# Patient Record
Sex: Female | Born: 1990 | Race: White | Hispanic: No | Marital: Single | Smoking: Current every day smoker
Health system: Southern US, Community
[De-identification: ages and names within clinical notes are randomized; demographics above are authoritative.]

## PROBLEM LIST (undated history)

## (undated) ENCOUNTER — Inpatient Hospital Stay (HOSPITAL_COMMUNITY): Payer: Self-pay

## (undated) DIAGNOSIS — F53 Postpartum depression: Secondary | ICD-10-CM

## (undated) DIAGNOSIS — A6000 Herpesviral infection of urogenital system, unspecified: Secondary | ICD-10-CM

## (undated) DIAGNOSIS — F419 Anxiety disorder, unspecified: Secondary | ICD-10-CM

## (undated) DIAGNOSIS — G43909 Migraine, unspecified, not intractable, without status migrainosus: Secondary | ICD-10-CM

## (undated) DIAGNOSIS — O99345 Other mental disorders complicating the puerperium: Secondary | ICD-10-CM

## (undated) DIAGNOSIS — F410 Panic disorder [episodic paroxysmal anxiety] without agoraphobia: Secondary | ICD-10-CM

## (undated) HISTORY — PX: ADENOIDECTOMY: SUR15

## (undated) HISTORY — PX: TONSILLECTOMY: SUR1361

---
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
## 2010-01-30 ENCOUNTER — Emergency Department (HOSPITAL_COMMUNITY)
Admission: EM | Admit: 2010-01-30 | Discharge: 2010-01-30 | Payer: Self-pay | Source: Home / Self Care | Admitting: Emergency Medicine

## 2010-02-06 ENCOUNTER — Other Ambulatory Visit: Admission: RE | Admit: 2010-02-06 | Discharge: 2010-02-06 | Payer: Self-pay | Admitting: Obstetrics and Gynecology

## 2010-04-08 ENCOUNTER — Emergency Department (HOSPITAL_COMMUNITY): Admission: EM | Admit: 2010-04-08 | Discharge: 2010-04-08 | Payer: Self-pay | Admitting: Emergency Medicine

## 2010-08-10 ENCOUNTER — Encounter: Payer: Self-pay | Admitting: Family Medicine

## 2010-09-27 ENCOUNTER — Emergency Department (HOSPITAL_COMMUNITY)
Admission: EM | Admit: 2010-09-27 | Discharge: 2010-09-27 | Disposition: A | Discharge status: Home / Self Care | Payer: Medicaid Other | Attending: Emergency Medicine | Admitting: Emergency Medicine

## 2010-09-27 ENCOUNTER — Emergency Department (HOSPITAL_COMMUNITY): Payer: Medicaid Other

## 2010-09-27 DIAGNOSIS — Y9229 Other specified public building as the place of occurrence of the external cause: Secondary | ICD-10-CM | POA: Insufficient documentation

## 2010-09-27 DIAGNOSIS — IMO0002 Reserved for concepts with insufficient information to code with codable children: Secondary | ICD-10-CM | POA: Insufficient documentation

## 2010-09-27 DIAGNOSIS — M25539 Pain in unspecified wrist: Secondary | ICD-10-CM | POA: Insufficient documentation

## 2010-09-27 DIAGNOSIS — S8010XA Contusion of unspecified lower leg, initial encounter: Secondary | ICD-10-CM | POA: Insufficient documentation

## 2010-09-27 DIAGNOSIS — S0990XA Unspecified injury of head, initial encounter: Secondary | ICD-10-CM | POA: Insufficient documentation

## 2010-09-27 DIAGNOSIS — S5010XA Contusion of unspecified forearm, initial encounter: Secondary | ICD-10-CM | POA: Insufficient documentation

## 2010-09-27 DIAGNOSIS — R51 Headache: Secondary | ICD-10-CM | POA: Insufficient documentation

## 2010-09-27 LAB — POCT PREGNANCY, URINE: Preg Test, Ur: NEGATIVE

## 2010-10-20 LAB — WOUND CULTURE: Gram Stain: NONE SEEN

## 2011-04-20 LAB — URINALYSIS, ROUTINE W REFLEX MICROSCOPIC
Glucose, UA: NEGATIVE
Hgb urine dipstick: NEGATIVE
Ketones, ur: 15 — AB
Nitrite: NEGATIVE
Protein, ur: NEGATIVE
Specific Gravity, Urine: >1.03 — ABNORMAL HIGH
Urobilinogen, UA: 0.2
pH: 5.5

## 2011-04-20 LAB — D-DIMER, QUANTITATIVE: D-Dimer, Quant: 0.31

## 2011-04-20 LAB — PREGNANCY, URINE: Preg Test, Ur: NEGATIVE

## 2011-12-21 ENCOUNTER — Other Ambulatory Visit (HOSPITAL_COMMUNITY)
Admission: RE | Admit: 2011-12-21 | Discharge: 2011-12-21 | Disposition: A | Discharge status: Home / Self Care | Payer: Medicaid Other | Source: Ambulatory Visit | Attending: Obstetrics and Gynecology | Admitting: Obstetrics and Gynecology

## 2011-12-21 ENCOUNTER — Other Ambulatory Visit: Payer: Self-pay | Admitting: Adult Health

## 2011-12-21 DIAGNOSIS — Z01419 Encounter for gynecological examination (general) (routine) without abnormal findings: Secondary | ICD-10-CM | POA: Insufficient documentation

## 2011-12-21 DIAGNOSIS — Z113 Encounter for screening for infections with a predominantly sexual mode of transmission: Secondary | ICD-10-CM | POA: Insufficient documentation

## 2012-01-14 LAB — OB RESULTS CONSOLE ABO/RH: RH Type: POSITIVE

## 2012-01-14 LAB — OB RESULTS CONSOLE RUBELLA ANTIBODY, IGM: Rubella: IMMUNE

## 2012-01-14 LAB — OB RESULTS CONSOLE HIV ANTIBODY (ROUTINE TESTING): HIV: NONREACTIVE

## 2012-01-14 LAB — OB RESULTS CONSOLE PLATELET COUNT: Platelets: 212 10*3/uL

## 2012-01-29 ENCOUNTER — Encounter (HOSPITAL_COMMUNITY): Payer: Self-pay | Admitting: *Deleted

## 2012-01-29 DIAGNOSIS — R109 Unspecified abdominal pain: Secondary | ICD-10-CM | POA: Insufficient documentation

## 2012-01-29 DIAGNOSIS — O99891 Other specified diseases and conditions complicating pregnancy: Secondary | ICD-10-CM | POA: Insufficient documentation
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
## 2012-01-30 ENCOUNTER — Emergency Department (HOSPITAL_COMMUNITY)
Admission: EM | Admit: 2012-01-30 | Discharge: 2012-01-30 | Disposition: A | Discharge status: Home / Self Care | Payer: Medicaid Other | Attending: Emergency Medicine | Admitting: Emergency Medicine

## 2012-01-30 DIAGNOSIS — R109 Unspecified abdominal pain: Secondary | ICD-10-CM

## 2012-01-30 MED ORDER — ACETAMINOPHEN 325 MG PO TABS
650.0000 mg | ORAL_TABLET | Freq: Once | ORAL | Status: AC
  Administered 2012-01-30: 650 mg via ORAL
  Filled 2012-01-30: qty 2
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
## 2012-05-03 LAB — OB RESULTS CONSOLE PLATELET COUNT: Platelets: 212 10*3/uL

## 2012-05-03 LAB — OB RESULTS CONSOLE HGB/HCT, BLOOD: HCT: 34 %

## 2012-06-02 ENCOUNTER — Other Ambulatory Visit: Payer: Self-pay | Admitting: Obstetrics & Gynecology

## 2012-06-20 ENCOUNTER — Inpatient Hospital Stay (HOSPITAL_COMMUNITY)
Admission: EM | Admit: 2012-06-20 | Discharge: 2012-06-21 | Disposition: A | Discharge status: Home / Self Care | Payer: Medicaid Other | Attending: Obstetrics & Gynecology | Admitting: Obstetrics & Gynecology

## 2012-06-20 ENCOUNTER — Encounter (HOSPITAL_COMMUNITY): Payer: Self-pay | Admitting: *Deleted

## 2012-06-20 ENCOUNTER — Emergency Department (HOSPITAL_COMMUNITY): Payer: Medicaid Other

## 2012-06-20 DIAGNOSIS — O4700 False labor before 37 completed weeks of gestation, unspecified trimester: Secondary | ICD-10-CM | POA: Insufficient documentation

## 2012-06-20 DIAGNOSIS — O99340 Other mental disorders complicating pregnancy, unspecified trimester: Secondary | ICD-10-CM | POA: Insufficient documentation

## 2012-06-20 DIAGNOSIS — F411 Generalized anxiety disorder: Secondary | ICD-10-CM | POA: Insufficient documentation

## 2012-06-20 DIAGNOSIS — O479 False labor, unspecified: Secondary | ICD-10-CM

## 2012-06-20 DIAGNOSIS — F419 Anxiety disorder, unspecified: Secondary | ICD-10-CM

## 2012-06-20 DIAGNOSIS — O99891 Other specified diseases and conditions complicating pregnancy: Secondary | ICD-10-CM | POA: Insufficient documentation

## 2012-06-20 DIAGNOSIS — R079 Chest pain, unspecified: Secondary | ICD-10-CM | POA: Insufficient documentation

## 2012-06-20 HISTORY — DX: Panic disorder (episodic paroxysmal anxiety): F41.0

## 2012-06-20 MED ORDER — SODIUM CHLORIDE 0.9 % IV BOLUS (SEPSIS)
1000.0000 mL | Freq: Once | INTRAVENOUS | Status: AC
  Administered 2012-06-20: 1000 mL via INTRAVENOUS

## 2012-06-20 MED ORDER — ACETAMINOPHEN-CODEINE #3 300-30 MG PO TABS: ORAL_TABLET | ORAL | Status: DC

## 2012-06-20 MED ORDER — HYDROMORPHONE HCL PF 1 MG/ML IJ SOLN
1.0000 mg | Freq: Once | INTRAMUSCULAR | Status: AC
  Administered 2012-06-20: 1 mg via INTRAVENOUS
  Filled 2012-06-20: qty 1

## 2012-06-20 MED ORDER — LORAZEPAM 2 MG/ML IJ SOLN
1.0000 mg | Freq: Once | INTRAMUSCULAR | Status: AC
  Administered 2012-06-20: 1 mg via INTRAVENOUS
  Filled 2012-06-20: qty 1

## 2012-06-20 NOTE — ED Notes (Signed)
Notified by Victorino Dike at [facility] that patient is starting to contract every 2 minutes again.  Dr. Adriana Simas aware and consult to Dr. Emelda Fear placed.

## 2012-06-20 NOTE — Progress Notes (Addendum)
Pt at [facility] G1[facility]0 co chest pain, SOB. FHR reassuring, pt received Dilaudid and Ativan. UCs noted, suggest IV fluid bolus. Pt w/o complaints of abd pain, back pain, cramping or contractions. Spoke with [facility] RN Dr Aileen Pilot called, reccommended fluid bolus with LR

## 2012-06-20 NOTE — ED Notes (Signed)
Received phone call from [place] from [employer]. Patient is having contractions; states to run a NS bolus.

## 2012-06-20 NOTE — ED Notes (Signed)
Patient states she is feeling better.  Respirations have slowed down and become unlabored.  NS infusing without difficulty.  Family at bedside.

## 2012-06-20 NOTE — Progress Notes (Signed)
Spoke with French Ana RN r/t UCs, suggest call to [facility]call [facility] MD. Herbert Seta CNM here at [facility] made aware of patient

## 2012-06-20 NOTE — ED Notes (Addendum)
Chest pain 2-3 days, hx of panic attacks.  Pt is pregnant,34 weeks.  No abd pain, no bleeding.  Has been feeling her baby move.

## 2012-06-21 ENCOUNTER — Encounter (HOSPITAL_COMMUNITY): Payer: Self-pay | Admitting: *Deleted

## 2012-06-21 DIAGNOSIS — O479 False labor, unspecified: Secondary | ICD-10-CM

## 2012-06-21 LAB — URINALYSIS, ROUTINE W REFLEX MICROSCOPIC
Hgb urine dipstick: NEGATIVE
Protein, ur: NEGATIVE mg/dL
Urobilinogen, UA: 0.2 mg/dL (ref 0.0–1.0)

## 2012-06-21 MED ORDER — HYDROXYZINE HCL 50 MG/ML IM SOLN: 50.0000 mg | Freq: Once | INTRAMUSCULAR | Status: DC

## 2012-06-21 MED ORDER — SODIUM CHLORIDE 0.9 % IV SOLN: INTRAVENOUS | Status: DC

## 2012-06-21 MED ORDER — HYDROXYZINE HCL 50 MG PO TABS
50.0000 mg | ORAL_TABLET | Freq: Once | ORAL | Status: AC
  Administered 2012-06-21: 50 mg via ORAL
  Filled 2012-06-21: qty 1

## 2012-06-21 MED ORDER — ACETAMINOPHEN 325 MG PO TABS: 650.0000 mg | ORAL_TABLET | Freq: Four times a day (QID) | ORAL | Status: DC | PRN

## 2012-06-21 MED ORDER — SODIUM CHLORIDE 0.9 % IV BOLUS (SEPSIS)
500.0000 mL | Freq: Once | INTRAVENOUS | Status: AC
  Administered 2012-06-21: 500 mL via INTRAVENOUS

## 2012-06-21 MED ORDER — LACTATED RINGERS IV BOLUS (SEPSIS)
1000.0000 mL | Freq: Once | INTRAVENOUS | Status: DC
Start: 2012-06-21 — End: 2012-06-21

## 2012-06-21 MED ORDER — HYDROXYZINE PAMOATE 25 MG PO CAPS: 25.0000 mg | ORAL_CAPSULE | Freq: Three times a day (TID) | ORAL | Status: DC | PRN

## 2012-06-21 NOTE — ED Notes (Signed)
Bimanual exam completed by Dr. Adriana Simas.  Patient to be transferred to [facility].

## 2012-06-21 NOTE — MAU Provider Note (Signed)
  History     CSN: [id number]  Arrival date and time: 06/20/12 2005   First Provider Initiated Contact with Patient 06/21/12 0159      Chief Complaint  Patient presents with  . Chest Pain   HPI  Pt was seen at [facility] ED for shortness of breath. She states that it comes and goes, and she thought it was an anxiety attack. After being seen there she was found to be medically cleared. However, she was having q3-53min BH contractions. She was examined by Dr. Emelda Fear, and was closed/thick/high. She was sent here to be evaluated for any cervical change. She states that she shortness of breath occurs on conjunction with the braxton hicks contractions. She denies any pain.   Past Medical History  Diagnosis Date  . Pregnant   . Panic attacks     Past Surgical History  Procedure Date  . Tonsillectomy     History reviewed. No pertinent family history.  History  Substance Use Topics  . Smoking status: Never Smoker   . Smokeless tobacco: Not on file  . Alcohol Use: No    Allergies: No Known Allergies  Prescriptions prior to admission  Medication Sig Dispense Refill  . acetaminophen (TYLENOL) 500 MG tablet Take 500 mg by mouth once as needed. For pain      . Prenatal Vit-Fe Fumarate-FA (PRENATAL MULTIVITAMIN) TABS Take 1 tablet by mouth daily.        Review of Systems  Constitutional: Negative for fever.  Eyes: Negative for blurred vision.  Gastrointestinal: Negative for heartburn, nausea, vomiting, abdominal pain, diarrhea and constipation.  Genitourinary: Negative for dysuria, urgency and frequency.  Neurological: Negative for headaches.   Physical Exam   Blood pressure 102/69, pulse 95, temperature 98.1 F (36.7 C), temperature source Oral, resp. rate 20, height 4\' 11"  (1.499 m), weight 69.854 kg (154 lb), SpO2 97.00%.  Physical Exam  Nursing note and vitals reviewed. Constitutional: She is oriented to person, place, and time. She appears well-developed and  well-nourished. No distress.  Cardiovascular: Normal rate.   Respiratory: Effort normal.  GI: Soft.  Genitourinary:        Cervix: closed/50/high FHT: 135, moderate 15x15 accels, no decels Toco: irregular UCs  Neurological: She is alert and oriented to person, place, and time.  Skin: Skin is warm and dry.  Psychiatric: She has a normal mood and affect.    MAU Course  Procedures    Assessment and Plan  Braxton Hicks contractions Discussed PTL versus preterm BH contractions with the patient Reviewed danger signs/when to return Reviewed comfort measures FU with FT as scheduled.   Tawnya Crook 06/21/2012, 2:50 AM

## 2012-06-21 NOTE — ED Notes (Signed)
Report called to [facility] in MAU at [facility].

## 2012-07-20 NOTE — L&D Delivery Note (Signed)
Delivery Note At 2:45 AM a viable female was delivered via Vaginal, Spontaneous Delivery (Presentation: Vertex; Occiput Anterior).  APGAR: 8, 9; weight pending.   Placenta status: Intact, Spontaneous delivered.  Cord:  3 vessel with the following complications: Nuchal Cord reduced.  Cord pH: N/A  Anesthesia: Other Epidural  Episiotomy: None Lacerations: None Suture Repair: None Est. Blood Loss (mL): 250  Mom to postpartum.  Baby to nursery-stable.  Gildardo Cranker 08/03/2012, 3:10 AM

## 2012-08-02 ENCOUNTER — Inpatient Hospital Stay (HOSPITAL_COMMUNITY)
Admission: AD | Admit: 2012-08-02 | Discharge: 2012-08-05 | DRG: 774 | Disposition: A | Discharge status: Home / Self Care | Payer: Medicaid Other | Source: Ambulatory Visit | Attending: Obstetrics & Gynecology | Admitting: Obstetrics & Gynecology

## 2012-08-02 ENCOUNTER — Inpatient Hospital Stay (HOSPITAL_COMMUNITY): Payer: Medicaid Other | Admitting: Anesthesiology

## 2012-08-02 ENCOUNTER — Encounter (HOSPITAL_COMMUNITY): Payer: Self-pay | Admitting: Anesthesiology

## 2012-08-02 ENCOUNTER — Encounter (HOSPITAL_COMMUNITY): Payer: Self-pay

## 2012-08-02 DIAGNOSIS — A6000 Herpesviral infection of urogenital system, unspecified: Secondary | ICD-10-CM | POA: Diagnosis present

## 2012-08-02 DIAGNOSIS — O99892 Other specified diseases and conditions complicating childbirth: Principal | ICD-10-CM | POA: Diagnosis present

## 2012-08-02 DIAGNOSIS — Z22330 Carrier of Group B streptococcus: Secondary | ICD-10-CM

## 2012-08-02 DIAGNOSIS — O98519 Other viral diseases complicating pregnancy, unspecified trimester: Secondary | ICD-10-CM | POA: Diagnosis present

## 2012-08-02 HISTORY — DX: Herpesviral infection of urogenital system, unspecified: A60.00

## 2012-08-02 LAB — CBC
MCH: 30.8 pg (ref 26.0–34.0)
MCHC: 34.8 g/dL (ref 30.0–36.0)
MCV: 88.4 fL (ref 78.0–100.0)
Platelets: 201 10*3/uL (ref 150–400)
RBC: 4.06 MIL/uL (ref 3.87–5.11)

## 2012-08-02 LAB — OB RESULTS CONSOLE GBS: GBS: POSITIVE

## 2012-08-02 MED ORDER — TERBUTALINE SULFATE 1 MG/ML IJ SOLN: 0.2500 mg | Freq: Once | INTRAMUSCULAR | Status: AC | PRN

## 2012-08-02 MED ORDER — PENICILLIN G POTASSIUM 5000000 UNITS IJ SOLR
2.5000 10*6.[IU] | INTRAVENOUS | Status: DC
  Administered 2012-08-02 – 2012-08-03 (×5): 2.5 10*6.[IU] via INTRAVENOUS
  Filled 2012-08-02 (×9): qty 2.5

## 2012-08-02 MED ORDER — PHENYLEPHRINE 40 MCG/ML (10ML) SYRINGE FOR IV PUSH (FOR BLOOD PRESSURE SUPPORT): 80.0000 ug | PREFILLED_SYRINGE | INTRAVENOUS | Status: DC | PRN

## 2012-08-02 MED ORDER — OXYTOCIN 40 UNITS IN LACTATED RINGERS INFUSION - SIMPLE MED
62.5000 mL/h | INTRAVENOUS | Status: DC
  Administered 2012-08-03: 999 mL/h via INTRAVENOUS
  Filled 2012-08-02: qty 1000

## 2012-08-02 MED ORDER — PENICILLIN G POTASSIUM 5000000 UNITS IJ SOLR
5.0000 10*6.[IU] | Freq: Once | INTRAVENOUS | Status: AC
  Administered 2012-08-02: 5 10*6.[IU] via INTRAVENOUS
  Filled 2012-08-02: qty 5

## 2012-08-02 MED ORDER — NALBUPHINE SYRINGE 5 MG/0.5 ML
INJECTION | INTRAMUSCULAR | Status: AC
  Filled 2012-08-02: qty 2

## 2012-08-02 MED ORDER — PRENATAL MULTIVITAMIN CH
1.0000 | ORAL_TABLET | Freq: Every day | ORAL | Status: DC
  Administered 2012-08-02: 1 via ORAL
  Filled 2012-08-02: qty 1

## 2012-08-02 MED ORDER — LIDOCAINE HCL (PF) 1 % IJ SOLN
30.0000 mL | INTRAMUSCULAR | Status: DC | PRN
  Filled 2012-08-02: qty 30

## 2012-08-02 MED ORDER — OXYTOCIN BOLUS FROM INFUSION: 500.0000 mL | INTRAVENOUS | Status: DC

## 2012-08-02 MED ORDER — DIPHENHYDRAMINE HCL 50 MG/ML IJ SOLN: 12.5000 mg | INTRAMUSCULAR | Status: DC | PRN

## 2012-08-02 MED ORDER — NALBUPHINE SYRINGE 5 MG/0.5 ML
10.0000 mg | INJECTION | Freq: Once | INTRAMUSCULAR | Status: AC
  Administered 2012-08-02: 10 mg via INTRAMUSCULAR

## 2012-08-02 MED ORDER — EPHEDRINE 5 MG/ML INJ
10.0000 mg | INTRAVENOUS | Status: DC | PRN
  Filled 2012-08-02: qty 4

## 2012-08-02 MED ORDER — OXYCODONE-ACETAMINOPHEN 5-325 MG PO TABS: 1.0000 | ORAL_TABLET | ORAL | Status: DC | PRN

## 2012-08-02 MED ORDER — FENTANYL 2.5 MCG/ML BUPIVACAINE 1/10 % EPIDURAL INFUSION (WH - ANES)
INTRAMUSCULAR | Status: DC | PRN
  Administered 2012-08-02: 11 mL/h via EPIDURAL

## 2012-08-02 MED ORDER — FENTANYL CITRATE 0.05 MG/ML IJ SOLN
50.0000 ug | INTRAMUSCULAR | Status: DC | PRN
  Administered 2012-08-02 (×4): 50 ug via INTRAVENOUS
  Filled 2012-08-02 (×4): qty 2

## 2012-08-02 MED ORDER — LACTATED RINGERS IV SOLN
INTRAVENOUS | Status: DC
  Administered 2012-08-02 (×2): via INTRAVENOUS

## 2012-08-02 MED ORDER — LACTATED RINGERS IV SOLN: 500.0000 mL | INTRAVENOUS | Status: DC | PRN

## 2012-08-02 MED ORDER — VALACYCLOVIR HCL 500 MG PO TABS
500.0000 mg | ORAL_TABLET | Freq: Two times a day (BID) | ORAL | Status: DC
  Administered 2012-08-02 (×2): 500 mg via ORAL
  Filled 2012-08-02 (×3): qty 1

## 2012-08-02 MED ORDER — NALBUPHINE SYRINGE 5 MG/0.5 ML
10.0000 mg | INJECTION | Freq: Once | INTRAMUSCULAR | Status: AC
  Administered 2012-08-02: 10 mg via INTRAVENOUS

## 2012-08-02 MED ORDER — FENTANYL 2.5 MCG/ML BUPIVACAINE 1/10 % EPIDURAL INFUSION (WH - ANES)
14.0000 mL/h | INTRAMUSCULAR | Status: DC
Start: 2012-08-02 — End: 2012-08-03
  Filled 2012-08-02: qty 125

## 2012-08-02 MED ORDER — ONDANSETRON HCL 4 MG/2ML IJ SOLN: 4.0000 mg | Freq: Four times a day (QID) | INTRAMUSCULAR | Status: DC | PRN

## 2012-08-02 MED ORDER — FLEET ENEMA 7-19 GM/118ML RE ENEM: 1.0000 | ENEMA | RECTAL | Status: DC | PRN

## 2012-08-02 MED ORDER — IBUPROFEN 600 MG PO TABS
600.0000 mg | ORAL_TABLET | Freq: Four times a day (QID) | ORAL | Status: DC | PRN
  Administered 2012-08-03: 600 mg via ORAL
  Filled 2012-08-02: qty 1

## 2012-08-02 MED ORDER — LACTATED RINGERS IV SOLN
500.0000 mL | Freq: Once | INTRAVENOUS | Status: AC
  Administered 2012-08-02: 500 mL via INTRAVENOUS

## 2012-08-02 MED ORDER — FENTANYL CITRATE 0.05 MG/ML IJ SOLN
100.0000 ug | INTRAMUSCULAR | Status: DC | PRN
  Administered 2012-08-02: 100 ug via INTRAVENOUS
  Filled 2012-08-02: qty 2

## 2012-08-02 MED ORDER — EPHEDRINE 5 MG/ML INJ: 10.0000 mg | INTRAVENOUS | Status: DC | PRN

## 2012-08-02 MED ORDER — LIDOCAINE HCL (PF) 1 % IJ SOLN
INTRAMUSCULAR | Status: DC | PRN
  Administered 2012-08-02: 4 mL
  Administered 2012-08-02: 3 mL

## 2012-08-02 MED ORDER — PHENYLEPHRINE 40 MCG/ML (10ML) SYRINGE FOR IV PUSH (FOR BLOOD PRESSURE SUPPORT)
80.0000 ug | PREFILLED_SYRINGE | INTRAVENOUS | Status: DC | PRN
  Filled 2012-08-02: qty 5

## 2012-08-02 MED ORDER — OXYTOCIN 40 UNITS IN LACTATED RINGERS INFUSION - SIMPLE MED
1.0000 m[IU]/min | INTRAVENOUS | Status: DC
  Administered 2012-08-02: 2 m[IU]/min via INTRAVENOUS
  Administered 2012-08-02: 14 m[IU]/min via INTRAVENOUS
  Administered 2012-08-03: 16 m[IU]/min via INTRAVENOUS

## 2012-08-02 MED ORDER — NALBUPHINE SYRINGE 5 MG/0.5 ML
10.0000 mg | INJECTION | Freq: Once | INTRAMUSCULAR | Status: AC
  Administered 2012-08-02: 10 mg via INTRAVENOUS
  Filled 2012-08-02 (×2): qty 0.5

## 2012-08-02 MED ORDER — ACETAMINOPHEN 325 MG PO TABS: 650.0000 mg | ORAL_TABLET | ORAL | Status: DC | PRN

## 2012-08-02 MED ORDER — CITRIC ACID-SODIUM CITRATE 334-500 MG/5ML PO SOLN: 30.0000 mL | ORAL | Status: DC | PRN

## 2012-08-02 NOTE — Anesthesia Procedure Notes (Signed)
Epidural Patient location during procedure: OB Start time: 08/02/2012 11:28 PM  Staffing Anesthesiologist: Deoni Cosey A. Performed by: anesthesiologist   Preanesthetic Checklist Completed: patient identified, site marked, surgical consent, pre-op evaluation, timeout performed, IV checked, risks and benefits discussed and monitors and equipment checked  Epidural Patient position: sitting Prep: site prepped and draped and DuraPrep Patient monitoring: continuous pulse ox and blood pressure Approach: midline Injection technique: LOR air  Needle:  Needle type: Tuohy  Needle gauge: 17 G Needle length: 9 cm and 9 Needle insertion depth: 5 cm cm Catheter type: closed end flexible Catheter size: 19 Gauge Catheter at skin depth: 10 cm Test dose: negative and Other  Assessment Events: blood not aspirated, injection not painful, no injection resistance, negative IV test and no paresthesia  Additional Notes Patient identified. Risks and benefits discussed including failed block, incomplete  Pain control, post dural puncture headache, nerve damage, paralysis, blood pressure Changes, nausea, vomiting, reactions to medications-both toxic and allergic and post Partum back pain. All questions were answered. Patient expressed understanding and wished to proceed. Sterile technique was used throughout procedure. Epidural site was Dressed with sterile barrier dressing. No paresthesias, signs of intravascular injection Or signs of intrathecal spread were encountered.  Patient was more comfortable after the epidural was dosed. Please see RN's note for documentation of vital signs and FHR which are stable.

## 2012-08-02 NOTE — H&P (Signed)
Chief Complaint:  Labor Eval HPI: Alice Foster is a 22 y.o. G1P0 at [redacted]w[redacted]d who presents to maternity admissions reporting contractions for the last two days.  Pt states she has felt increased abdominal pain/pressures since then and her contractions have increased in time.  Now happening every couple of minutes.    Denies leakage of fluid or vaginal bleeding. Good fetal movement. No fever, chills, sweats, SOB, CP, leg edema.   GBS +  Pregnancy Course: Uncomplicated up until this point.   Past Medical History: Past Medical History  Diagnosis Date  . Pregnant   . Panic attacks   . 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 Tonsillectomy   . Adenoidectomy     Family History: Family History  Problem Relation Age of Onset  . Other Neg Hx     Social History: History  Substance Use Topics  . Smoking status: Never Smoker   . Smokeless tobacco: Not on file  . Alcohol Use: No    Allergies: No Known Allergies  Meds:  Prescriptions prior to admission  Medication Sig Dispense Refill  . Prenatal Vit-Fe Fumarate-FA (PRENATAL MULTIVITAMIN) TABS Take 1 tablet by mouth daily.      . valACYclovir (VALTREX) 500 MG tablet Take 500 mg by mouth 2 (two) times daily.      Marland Kitchen acetaminophen (TYLENOL) 500 MG tablet Take 500 mg by mouth once as needed. For pain      . acetaminophen-codeine (TYLENOL #3) 300-30 MG per tablet Take one every 8 hours for severe pain  6 tablet  0  . hydrOXYzine (VISTARIL) 25 MG capsule Take 1 capsule (25 mg total) by mouth 3 (three) times daily as needed for anxiety.  30 capsule  0    ROS: Pertinent findings in history of present illness.  Physical Exam  Blood pressure 120/78, pulse 95, temperature 98.1 F (36.7  C), temperature source Oral, resp. rate 20, height 5' (1.524 m), weight 73.936 kg (163 lb). GENERAL: Well-developed, well-nourished female in no acute distress.  HEENT: normocephalic HEART: RRR RESP:CTA ABDOMEN: Soft, non-tender, gravid appropriate for gestational age EXTREMITIES: Nontender, no edema NEURO: alert and oriented SPECULUM EXAM: NEFG, physiologic discharge, no blood, cervix clean, no evidence of lesions  Dilation: 3 Effacement (%): 80 Station: -3 Presentation: Vertex Exam by:: Lucy Chris RNC  FHT:  Baseline 150 , moderate variability, accelerations present, no decelerations Contractions: q 3-5 mins   Labs: Results for orders placed during the hospital encounter of 08/02/12 (from the past 24 hour(s))  OB RESULTS CONSOLE GBS     Status: Normal      Component Value Range   GBS Positive      Imaging:  No results found.  MAU Course:1)In some abdominal pain secondary to contractions.  Continue to monitor for progressing dilation.     Assessment: Early Labor  GBS + HSV +  Plan: Admission for progression of labor.   PCN for GBS Valtrex for HSV  Bryan R. Paulina Fusi, DO of [facility] 08/02/2012, 4:41 AM  I have seen the patient with the resident and agree with the above.

## 2012-08-02 NOTE — Progress Notes (Signed)
Patient ID: Alice Foster, female   DOB: 1990-08-27, 22 y.o.   MRN: [id number]  S:  Pt in good amount of pain right now, would like epidural.  Ctx steady  O:  Filed Vitals:   08/02/12 2101 08/02/12 2131 08/02/12 2201 08/02/12 2231  BP: 134/80 116/76 125/92 114/68  Pulse: 69 72 82 69  Temp: 97.6 F (36.4 C)     TempSrc: Oral     Resp: 20 18 18 20   Height:      Weight:      SpO2:       Cervix:  5/90/-1 FHTs:  120, mod var, accels present, None TOCO:  q 2-4 mins  A/P 22 y.o. G1P0000 at [redacted]w[redacted]d with SOL - FHTs reassuring - AROM and pitocin started - Continue to monitor for progression of labor  Zully Frane R. Paulina Fusi, DO of [facility] 08/02/2012, 10:47 PM

## 2012-08-02 NOTE — Progress Notes (Signed)
Patient ID: Alice Foster, female   DOB: [birth year], 22 y.o.   MRN: [id number]  S:  Pt has been asking for IV pain meds but does not look that uncomfortable and not contracting that often.  O:  Filed Vitals:   08/02/12 1310 08/02/12 1405 08/02/12 1423 08/02/12 1428  BP:   123/76   Pulse:   83   Temp:   98.1 F (36.7 C)   TempSrc:   Oral   Resp: 18 20 18 20   Height:      Weight:      SpO2:       Cervix:  5/80/-2 FHTs:  120, mod var, accels present, occasional small variable TOCO:  q 6-7 min  A/P 21 y.o. G1P0000 at [redacted]w[redacted]d with SOL - FHTs reassuring - Prolonged latent phase, ctx spacing out. Already AROM'd - Will start pitocin  Napoleon Form, MD 08/02/2012 4:06 PM

## 2012-08-02 NOTE — Progress Notes (Signed)
Admission nutrition screen triggered for weight loss > 10 Lbs over the past month. PNR indicates steady and adequate weight gain. Patients chart reviewed and assessed  for nutritional risk. Patient is determined to be at low nutrition  risk.   Alice Foster Luster LDN Neonatal Nutrition Support Specialist Pager [phone]

## 2012-08-02 NOTE — Anesthesia Preprocedure Evaluation (Signed)
Anesthesia Evaluation  Patient identified by MRN, date of birth, ID band Patient awake    Reviewed: Allergy & Precautions, H&P , Patient's Chart, lab work & pertinent test results  Airway Mallampati: III TM Distance: >3 FB Neck ROM: full    Dental No notable dental hx. (+) Teeth Intact   Pulmonary neg pulmonary ROS,  breath sounds clear to auscultation  Pulmonary exam normal       Cardiovascular negative cardio ROS  Rhythm:regular Rate:Normal     Neuro/Psych Anxiety Panic Attacksnegative neurological ROS     GI/Hepatic negative GI ROS, Neg liver ROS,   Endo/Other  negative endocrine ROS  Renal/GU negative Renal ROS  negative genitourinary   Musculoskeletal   Abdominal Normal abdominal exam  (+)   Peds  Hematology negative hematology ROS (+)   Anesthesia Other Findings   Reproductive/Obstetrics (+) Pregnancy HSV                           Anesthesia Physical Anesthesia Plan  ASA: II  Anesthesia Plan: Epidural   Post-op Pain Management:    Induction:   Airway Management Planned:   Additional Equipment:   Intra-op Plan:   Post-operative Plan:   Informed Consent: I have reviewed the patients History and Physical, chart, labs and discussed the procedure including the risks, benefits and alternatives for the proposed anesthesia with the patient or authorized representative who has indicated his/her understanding and acceptance.     Plan Discussed with: Anesthesiologist  Anesthesia Plan Comments:         Anesthesia Quick Evaluation

## 2012-08-02 NOTE — Progress Notes (Signed)
Patient ID: Alice Foster, female   DOB: [birth year], 22 y.o.   MRN: [id number]  S:  Pt unblocked. Uncomfortable with contractions but does not want epidural.  O:   Filed Vitals:   08/02/12 1423  BP: 123/76  Pulse: 83  Temp:   Resp:    Cervix:  4.5/80/-2 AROM, clear  FHTs:  120, mod var, accels present, occasional variable.   CTX:  q 3-5 min (not tracing well)  A/P SOL, progressing slowly in latent phase. AROM, clear. Anticipate SVD  Napoleon Form, MD 08/02/2012 2:26 PM

## 2012-08-02 NOTE — MAU Note (Signed)
Contractions every 2 minutes x2 days. Denies leaking of fluid or vaginal bleeding.

## 2012-08-03 ENCOUNTER — Encounter (HOSPITAL_COMMUNITY): Payer: Self-pay | Admitting: *Deleted

## 2012-08-03 DIAGNOSIS — A6000 Herpesviral infection of urogenital system, unspecified: Secondary | ICD-10-CM

## 2012-08-03 DIAGNOSIS — O9989 Other specified diseases and conditions complicating pregnancy, childbirth and the puerperium: Secondary | ICD-10-CM

## 2012-08-03 DIAGNOSIS — O98519 Other viral diseases complicating pregnancy, unspecified trimester: Secondary | ICD-10-CM

## 2012-08-03 MED ORDER — DIPHENHYDRAMINE HCL 25 MG PO CAPS: 25.0000 mg | ORAL_CAPSULE | Freq: Four times a day (QID) | ORAL | Status: DC | PRN

## 2012-08-03 MED ORDER — TETANUS-DIPHTH-ACELL PERTUSSIS 5-2.5-18.5 LF-MCG/0.5 IM SUSP
0.5000 mL | Freq: Once | INTRAMUSCULAR | Status: AC
  Administered 2012-08-04: 0.5 mL via INTRAMUSCULAR
  Filled 2012-08-03: qty 0.5

## 2012-08-03 MED ORDER — ZOLPIDEM TARTRATE 5 MG PO TABS: 5.0000 mg | ORAL_TABLET | Freq: Every evening | ORAL | Status: DC | PRN

## 2012-08-03 MED ORDER — PRENATAL MULTIVITAMIN CH
1.0000 | ORAL_TABLET | Freq: Every day | ORAL | Status: DC
  Administered 2012-08-03 – 2012-08-05 (×3): 1 via ORAL
  Filled 2012-08-03 (×3): qty 1

## 2012-08-03 MED ORDER — ONDANSETRON HCL 4 MG/2ML IJ SOLN: 4.0000 mg | INTRAMUSCULAR | Status: DC | PRN

## 2012-08-03 MED ORDER — SENNOSIDES-DOCUSATE SODIUM 8.6-50 MG PO TABS
2.0000 | ORAL_TABLET | Freq: Every day | ORAL | Status: DC
  Administered 2012-08-03: 2 via ORAL

## 2012-08-03 MED ORDER — ONDANSETRON HCL 4 MG PO TABS: 4.0000 mg | ORAL_TABLET | ORAL | Status: DC | PRN

## 2012-08-03 MED ORDER — BENZOCAINE-MENTHOL 20-0.5 % EX AERO
1.0000 "application " | INHALATION_SPRAY | CUTANEOUS | Status: DC | PRN
  Administered 2012-08-05: 1 via TOPICAL
  Filled 2012-08-03 (×2): qty 56

## 2012-08-03 MED ORDER — WITCH HAZEL-GLYCERIN EX PADS: 1.0000 "application " | MEDICATED_PAD | CUTANEOUS | Status: DC | PRN

## 2012-08-03 MED ORDER — SIMETHICONE 80 MG PO CHEW
80.0000 mg | CHEWABLE_TABLET | ORAL | Status: DC | PRN
  Administered 2012-08-04: 80 mg via ORAL

## 2012-08-03 MED ORDER — DIBUCAINE 1 % RE OINT: 1.0000 "application " | TOPICAL_OINTMENT | RECTAL | Status: DC | PRN

## 2012-08-03 MED ORDER — IBUPROFEN 600 MG PO TABS
600.0000 mg | ORAL_TABLET | Freq: Four times a day (QID) | ORAL | Status: DC
  Administered 2012-08-03 – 2012-08-05 (×8): 600 mg via ORAL
  Filled 2012-08-03 (×8): qty 1

## 2012-08-03 MED ORDER — OXYCODONE-ACETAMINOPHEN 5-325 MG PO TABS
1.0000 | ORAL_TABLET | ORAL | Status: DC | PRN
  Administered 2012-08-03 – 2012-08-05 (×6): 1 via ORAL
  Filled 2012-08-03 (×6): qty 1

## 2012-08-03 MED ORDER — LANOLIN HYDROUS EX OINT: TOPICAL_OINTMENT | CUTANEOUS | Status: DC | PRN

## 2012-08-03 NOTE — Anesthesia Postprocedure Evaluation (Signed)
  Anesthesia Post-op Note  Patient: Alice Foster  Procedure(s) Performed: * No procedures listed *  Patient Location: PACU and Mother/Baby  Anesthesia Type:Epidural  Level of Consciousness: awake, alert , oriented and patient cooperative  Airway and Oxygen Therapy: Patient Spontanous Breathing  Post-op Pain: mild  Post-op Assessment: Patient's Cardiovascular Status Stable, Respiratory Function Stable, Patent Airway, No signs of Nausea or vomiting, Adequate PO intake and Pain level controlled  Post-op Vital Signs: Reviewed and stable  Complications: No apparent anesthesia complications

## 2012-08-03 NOTE — Progress Notes (Signed)
I examined pt and agree with documentation above and PA-S plan of care. [name],[name]  

## 2012-08-03 NOTE — Progress Notes (Signed)
Post Partum Day 0 Subjective: no complaints and voiding.  Patient is ambulatory and has urinated on her own.  She reports a small amount of bleeding with no clots.  The patient reports no flatus and no BM.  The patient denies SOB, chest pain, leg pain, H/A, or syncope.  The patient denies any abdominal pain.    Objective: Blood pressure 106/70, pulse 106, temperature 98.7 F (37.1 C), temperature source Oral, resp. rate 20, height 5' (1.524 m), weight 163 lb (73.936 kg), SpO2 97.00%, unknown if currently breastfeeding.  Physical Exam:  General: alert, cooperative, appears stated age and no distress Lochia: appropriate Uterine Fundus: firm DVT Evaluation: No evidence of DVT seen on physical exam. Negative Homan's sign. No significant calf/ankle edema. Heart: RRR Lungs: clear and equal breath sounds ABD: no tenderness, gurading  Basename 08/02/12 0445  HGB 12.5  HCT 35.9*    Assessment/Plan: Contraception : plans to speak with Dr. Macon Large about contraception.  Plan for discharge tomorrow.   LOS: 1 day   Evalee Mutton 08/03/2012, 7:24 AM

## 2012-08-03 NOTE — H&P (Signed)
Attestation of Attending Supervision of Advanced Practitioner (PA/CNM/NP): Evaluation and management procedures were performed by the Advanced Practitioner under my supervision and collaboration.  I have reviewed the Advanced Practitioner's note and chart, and I agree with the management and plan.  Jenay Morici, MD, FACOG Attending Obstetrician & Gynecologist Faculty Practice, [facility]  

## 2012-08-03 NOTE — Progress Notes (Signed)
UR completed 

## 2012-08-03 NOTE — Progress Notes (Signed)
Subjective: Pt doing well with epidural.  No complaints right now  Objective: BP 94/64  Pulse 125  Temp 97.5 F (36.4 C) (Oral)  Resp 20  Ht 5' (1.524 m)  Wt 73.936 kg (163 lb)  BMI 31.83 kg/m2  SpO2 96%      FHT:  FHR: 130 bpm, variability: moderate,  accelerations:  Present,  decelerations:  Absent UC:   irregular, every 2-3 minutes SVE:   Dilation: 6.5 Effacement (%): 100 Station: 0 Exam by:: Wen Munford MD  Labs: Lab Results  Component Value Date   WBC 12.2* 08/02/2012   HGB 12.5 08/02/2012   HCT 35.9* 08/02/2012   MCV 88.4 08/02/2012   PLT 201 08/02/2012    Assessment / Plan: Spontaneous labor, progressing normally  Labor: Progressing on Pitocin, will continue to increase then AROM Fetal Wellbeing:  Category I Pain Control:  Epidural I/D:  n/a Anticipated MOD:  NSVD  Gildardo Cranker 08/03/2012, 12:17 AM

## 2012-08-04 LAB — CBC
HCT: 31.2 % — ABNORMAL LOW (ref 36.0–46.0)
Hemoglobin: 10.5 g/dL — ABNORMAL LOW (ref 12.0–15.0)
MCH: 30.3 pg (ref 26.0–34.0)
MCHC: 33.7 g/dL (ref 30.0–36.0)
MCV: 90.2 fL (ref 78.0–100.0)
RDW: 13.4 % (ref 11.5–15.5)

## 2012-08-04 NOTE — Progress Notes (Signed)
Post Partum Day 1 Subjective: no complaints, up ad lib, voiding and tolerating PO, small lochia, plans to bottle feed, undecided about BC--options discussed  Objective: Blood pressure 121/85, pulse 99, temperature 97.8 F (36.6 C), temperature source Oral, resp. rate 18, height 5' (1.524 m), weight 163 lb (73.936 kg), SpO2 96.00%, unknown if currently breastfeeding.  Physical Exam:  General: alert, cooperative and no distress Lochia:normal flow Chest: CTAB Heart: RRR no m/r/g Abdomen: +BS, soft, nontender,  Uterine Fundus: firm DVT Evaluation: No evidence of DVT seen on physical exam. Extremities: no edema   Basename 08/04/12 0555 08/02/12 0445  HGB 10.5* 12.5  HCT 31.2* 35.9*    Assessment/Plan: Plan for discharge tomorrow   LOS: 2 days   CRESENZO-DISHMAN,Ibraheem Voris 08/04/2012, 7:27 AM

## 2012-08-04 NOTE — Clinical Social Work Maternal (Signed)
    Clinical Social Work Department PSYCHOSOCIAL ASSESSMENT - MATERNAL/CHILD 08/04/2012  Patient:  Alice Foster, Alice Foster  Account Number:  [id number]  Admit Date:  08/02/2012  Marjo Bicker Name:   Christ Kick    Clinical Social Worker:  Nobie Putnam, LCSW   Date/Time:  08/04/2012 01:50 PM  Date Referred:  08/04/2012   Referral source  CN     Referred reason  Substance Abuse   Other referral source:    I:  FAMILY / HOME ENVIRONMENT Child's legal guardian:  PARENT  Guardian - Name Guardian - Age Guardian - Address  Alice Foster [address] [place], [place] [address]  Alice Foster 22    Other household support members/support persons Name Relationship DOB  Alice Foster MOTHER    SISTER 84 years old   Other support:    II  PSYCHOSOCIAL DATA Information Source:  Patient Interview  Event organiser Employment:   Surveyor, quantity resources:  [employer] If Medicaid - County:  [employer] Other  Chemical engineer / Grade:   Maternity Care Coordinator / Child Services Coordination / Early Interventions:  Cultural issues impacting care:    III  STRENGTHS Strengths  Adequate Resources  Home prepared for Child (including basic supplies)  Supportive family/friends   Strength comment:    IV  RISK FACTORS AND CURRENT PROBLEMS Current Problem:  YES   Risk Factor & Current Problem Patient Issue Family Issue Risk Factor / Current Problem Comment  Substance Abuse Y N Hx of MJ use    V  SOCIAL WORK ASSESSMENT CSW met with pt to assess history of MJ use.  Pt admits to smoking MJ, daily prior to pregnancy confirmation at 5 weeks.  Once pregnancy was confirmed, she reports that she stopped immediately.  Pt verbalized understanding of hospital drug testing policy.  UDS is negative, meconium results are pending.  Pt's mother & FOB were at the bedside, asleep.  She has all the necessary supplies for the infant.  No history of depression or SI.  Pt appears to be  appropriate and doing well at this time.  CSW will continue to monitor drug screen results and make a referral if needed.      VI SOCIAL WORK PLAN Social Work Plan  No Further Intervention Required / No Barriers to Discharge   Type of pt/family education:   If child protective services report - county:   If child protective services report - date:   Information/referral to community resources comment:   Other social work plan:

## 2012-08-05 MED ORDER — IBUPROFEN 600 MG PO TABS: 600.0000 mg | ORAL_TABLET | Freq: Four times a day (QID) | ORAL | Status: DC

## 2012-08-05 NOTE — Discharge Summary (Signed)
Obstetric Discharge Summary Reason for Admission: onset of labor Prenatal Procedures: none Intrapartum Procedures: spontaneous vaginal delivery Postpartum Procedures: none Complications-Operative and Postpartum: none Hemoglobin  Date Value Range Status  08/04/2012 10.5* 12.0 - 15.0 g/dL Final  96/29/5284 13.2   Final     HCT  Date Value Range Status  08/04/2012 31.2* 36.0 - 46.0 % Final  05/03/2012 34   Final    Physical Exam:  General: alert, cooperative, appears stated age and no distress Lochia: appropriate Uterine Fundus: firm DVT Evaluation: No evidence of DVT seen on physical exam. Negative Homan's sign. No cords or calf tenderness. No significant calf/ankle edema.  Discharge Diagnoses: Term Pregnancy-delivered  Discharge Information: Date: 08/05/2012 Activity: pelvic rest Diet: routine Medications: Ibuprofen Condition: stable Instructions: refer to practice specific booklet Discharge to: home Follow-up Information    Follow up with [facility]. In 6 weeks. (follow up as scheduled)    Contact information:   [address] [place] [address] [phone]         Newborn Data: Live born female  Birth Weight: 6 lb 4.8 oz (2858 g) APGAR: 8, 9  Home with mother.  Plan on contraception with follow up  Alice Foster 08/05/2012, 7:39 AM

## 2012-08-06 NOTE — Discharge Summary (Signed)
I saw and examined patient and agree with above. Patient has appointment at [facility] on Feb 26. Napoleon Form, MD

## 2012-10-30 ENCOUNTER — Encounter (HOSPITAL_COMMUNITY): Payer: Self-pay | Admitting: *Deleted

## 2012-10-30 ENCOUNTER — Emergency Department (HOSPITAL_COMMUNITY)
Admission: EM | Admit: 2012-10-30 | Discharge: 2012-10-30 | Disposition: A | Discharge status: Home / Self Care | Payer: MEDICAID | Attending: Emergency Medicine | Admitting: Emergency Medicine

## 2012-10-30 DIAGNOSIS — Z79899 Other long term (current) drug therapy: Secondary | ICD-10-CM | POA: Insufficient documentation

## 2012-10-30 DIAGNOSIS — Z8619 Personal history of other infectious and parasitic diseases: Secondary | ICD-10-CM | POA: Insufficient documentation

## 2012-10-30 DIAGNOSIS — R4583 Excessive crying of child, adolescent or adult: Secondary | ICD-10-CM | POA: Insufficient documentation

## 2012-10-30 DIAGNOSIS — F410 Panic disorder [episodic paroxysmal anxiety] without agoraphobia: Secondary | ICD-10-CM | POA: Insufficient documentation

## 2012-10-30 MED ORDER — CITALOPRAM HYDROBROMIDE 20 MG PO TABS
ORAL_TABLET | ORAL | Status: AC
  Filled 2012-10-30: qty 1

## 2012-10-30 MED ORDER — LORAZEPAM 1 MG PO TABS
2.0000 mg | ORAL_TABLET | Freq: Once | ORAL | Status: AC
  Administered 2012-10-30: 2 mg via ORAL
  Filled 2012-10-30: qty 2

## 2012-10-30 MED ORDER — CITALOPRAM HYDROBROMIDE 20 MG PO TABS: 20.0000 mg | ORAL_TABLET | Freq: Every day | ORAL | Status: DC

## 2012-10-30 MED ORDER — CITALOPRAM HYDROBROMIDE 20 MG PO TABS
20.0000 mg | ORAL_TABLET | Freq: Every day | ORAL | Status: DC
  Administered 2012-10-30: 20 mg via ORAL
  Filled 2012-10-30 (×3): qty 1

## 2012-10-30 NOTE — ED Provider Notes (Signed)
History     CSN: [id number]  Arrival date & time 10/30/12  1627   First MD Initiated Contact with Patient 10/30/12 1636      Chief Complaint  Patient presents with  . Panic Attack    (Consider location/radiation/quality/duration/timing/severity/associated sxs/prior treatment) The history is provided by the patient.   the patient is a 46-month-old baby at home and states that her baby was crying and was very hungry and she was unable to calm her baby down.  This caused her to develop some discomfort and pressure in her chest and then a feeling of anxiousness followed by tearfulness.  She states this has been occurring more frequently.  She had panic attacks in the past prior to the birth of her child.  The baby's father is not very involved at home.  She has some support at home from the patient's sister and the father's sister.  She has never once thought of hurting or harming her child.  She does not feel that the child is in danger.  She denies unilateral leg swelling.  No history of DVT or pulmonary embolism.  No recent cough congestion.  She is tearful throughout the history.  Past Medical History  Diagnosis Date  . Panic attacks   . 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 Tonsillectomy    . Adenoidectomy      Family History  Problem Relation Age of Onset  . Other Neg Hx   . Asthma Sister   . Asthma Brother     History  Substance Use Topics  . Smoking status: Never Smoker   . Smokeless tobacco: Not on file  . Alcohol Use: No    OB History   Grav Para Term Preterm Abortions TAB SAB Ect Mult Living   1 1 1  0 0 0 0 0 0 1      Review of Systems  All other systems reviewed and are negative.    Allergies  Review of patient's allergies indicates no known allergies.  Home Medications   Current Outpatient Rx  Name  Route  Sig  Dispense  Refill  . citalopram (CELEXA) 20 MG tablet   Oral   Take 1 tablet (20 mg  total) by mouth daily.   30 tablet   0     BP 131/73  Pulse 94  Temp(Src) 97.6 F (36.4 C) (Oral)  Resp 28  Ht 5\' 5"  (1.651 m)  Wt 147 lb 3.2 oz (66.769 kg)  BMI 24.5 kg/m2  SpO2 100%  LMP 10/20/2012  Physical Exam  Nursing note and vitals reviewed. Constitutional: She is oriented to person, place, and time. She appears well-developed and well-nourished. No distress.  HENT:  Head: Normocephalic and atraumatic.  Eyes: EOM are normal.  Neck: Normal range of motion.  Cardiovascular: Normal rate, regular rhythm and normal heart sounds.   Pulmonary/Chest: Effort normal and breath sounds normal.  Abdominal: Soft. She exhibits no distension. There is no tenderness.  Musculoskeletal: Normal range of motion.  Neurological: She is alert and oriented to person, place, and time.  Psychiatric: Her speech is normal and behavior is normal. Judgment and thought content normal. She is not withdrawn and not actively hallucinating. Thought content is not paranoid. Cognition and memory are normal. She exhibits a depressed mood. She expresses no homicidal and no suicidal ideation.    ED Course  Procedures (including critical care time)  Labs Reviewed -  No data to display No results found.   1. Anxiety attack       MDM  5:54 PM The patient feels much better at this time.  I will initiate patient on Celexa.  It sounds as though the patient is not a threat to her baby.  The baby seems to be in a safe place.  The patient has never thought about hitting her killing her baby.  This may represent post partum depression thus I think Celexa is a good choice.  She is not breast-feeding.  She has some support at home although it does not sound very strong        Lyanne Co, MD 10/30/12 1759

## 2012-10-30 NOTE — ED Notes (Signed)
AC called for med 

## 2012-10-30 NOTE — ED Notes (Signed)
Pt states she has been stressed out over the past two weeks. States her 67 mo old baby began crying and wouldn't stop so the patient began crying and couldn't stop. Panic attack began 45 min ago.

## 2013-02-25 ENCOUNTER — Emergency Department (HOSPITAL_COMMUNITY)
Admission: EM | Admit: 2013-02-25 | Discharge: 2013-02-26 | Disposition: A | Discharge status: Home / Self Care | Payer: Medicaid Other | Attending: Emergency Medicine | Admitting: Emergency Medicine

## 2013-02-25 ENCOUNTER — Emergency Department (HOSPITAL_COMMUNITY): Payer: Medicaid Other

## 2013-02-25 ENCOUNTER — Encounter (HOSPITAL_COMMUNITY): Payer: Self-pay

## 2013-02-25 DIAGNOSIS — W010XXA Fall on same level from slipping, tripping and stumbling without subsequent striking against object, initial encounter: Secondary | ICD-10-CM | POA: Insufficient documentation

## 2013-02-25 DIAGNOSIS — Z79899 Other long term (current) drug therapy: Secondary | ICD-10-CM | POA: Insufficient documentation

## 2013-02-25 DIAGNOSIS — T148XXA Other injury of unspecified body region, initial encounter: Secondary | ICD-10-CM

## 2013-02-25 DIAGNOSIS — Y929 Unspecified place or not applicable: Secondary | ICD-10-CM | POA: Insufficient documentation

## 2013-02-25 DIAGNOSIS — Y9389 Activity, other specified: Secondary | ICD-10-CM | POA: Insufficient documentation

## 2013-02-25 DIAGNOSIS — Z8619 Personal history of other infectious and parasitic diseases: Secondary | ICD-10-CM | POA: Insufficient documentation

## 2013-02-25 DIAGNOSIS — F17200 Nicotine dependence, unspecified, uncomplicated: Secondary | ICD-10-CM | POA: Insufficient documentation

## 2013-02-25 DIAGNOSIS — M25532 Pain in left wrist: Secondary | ICD-10-CM

## 2013-02-25 DIAGNOSIS — S63509A Unspecified sprain of unspecified wrist, initial encounter: Secondary | ICD-10-CM | POA: Insufficient documentation

## 2013-02-25 DIAGNOSIS — Y92009 Unspecified place in unspecified non-institutional (private) residence as the place of occurrence of the external cause: Secondary | ICD-10-CM

## 2013-02-25 DIAGNOSIS — F410 Panic disorder [episodic paroxysmal anxiety] without agoraphobia: Secondary | ICD-10-CM | POA: Insufficient documentation

## 2013-02-25 NOTE — ED Notes (Signed)
Pt states she fell on Wednesday, states she put her left hand out to brace her fall and is having pain to hand, and also up into shoulder. Pt states she took ibuprofen on thursday

## 2013-02-25 NOTE — ED Provider Notes (Signed)
CSN: [id number]     Arrival date & time 02/25/13  2315 History    This chart was scribed for Jones Skene, MD by Quintella Reichert, ED scribe.  This patient was seen in room APA19/APA19 and the patient's care was started at 11:38 PM.     Chief Complaint  Patient presents with  . Fall    The history is provided by the patient. No language interpreter was used.    HPI Comments: Alice Foster is a 22 y.o. female who presents to the Emergency Department complaining of a fall that occurred 3 days ago with subsequent left hand pain.  Pt states she tripped over a dog and fell onto her outstretched left hand.  Presently she complains of constant moderate pain to the left hand with associated numbness to some parts of the left forearm and fingers.  She also notes some soreness to the left shoulder.  She denies hand weakness.  She has attempted to treat pain with acetaminophen and muscle relaxants, without relief.  She denies fevers, chills, CP, SOB, or any other associated symptoms.  Pt also mentions that she has a h/o anxiety and has run out of Celexa which she was prescribed by an ED physician at a prior visit for an anxiety attack.  She states that it was providing some relief.  She denies any other chronic medical conditions.  She is allergic to hydrocodone.    Pt has no PCP.    Past Medical History  Diagnosis Date  . Panic attacks   . 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 Tonsillectomy    . Adenoidectomy     Family History  Problem Relation Age of Onset  . Other Neg Hx   . Asthma Sister   . Asthma Brother    History  Substance Use Topics  . Smoking status: Current Every Day Smoker  . Smokeless tobacco: Not on file  . Alcohol Use: No   OB History   Grav Para Term Preterm Abortions TAB SAB Ect Mult Living   1 1 1  0 0 0 0 0 0 1      Review of Systems   Allergies  Review of patient's allergies indicates no known  allergies.  Home Medications   Current Outpatient Rx  Name  Route  Sig  Dispense  Refill  . citalopram (CELEXA) 20 MG tablet   Oral   Take 1 tablet (20 mg total) by mouth daily.   30 tablet   0    BP 111/68  Pulse 77  Temp(Src) 98.7 F (37.1 C) (Oral)  Resp 18  Ht 4\' 11"  (1.499 m)  Wt 150 lb (68.04 kg)  BMI 30.28 kg/m2  SpO2 98%  LMP 02/23/2013  Physical Exam  Nursing notes reviewed.  Electronic medical record reviewed. VITAL SIGNS:   Filed Vitals:   02/25/13 2336  BP: 111/68  Pulse: 77  Temp: 98.7 F (37.1 C)  TempSrc: Oral  Resp: 18  Height: 4\' 11"  (1.499 m)  Weight: 150 lb (68.04 kg)  SpO2: 98%   CONSTITUTIONAL: Awake, oriented, appears non-toxic HENT: Atraumatic, normocephalic, oral mucosa pink and moist, airway patent. Nares patent without drainage. External ears normal. EYES: Conjunctiva clear, EOMI, PERRLA NECK: Trachea midline, non-tender, supple CARDIOVASCULAR: Normal heart rate, Normal rhythm, No murmurs, rubs, gallops PULMONARY/CHEST: Clear to auscultation, no rhonchi, wheezes, or rales. Symmetrical breath sounds. Non-tender. ABDOMINAL: Non-distended, soft, non-tender - no  rebound or guarding.  BS normal. NEUROLOGIC: Non-focal, moving all four extremities, no gross sensory or motor deficits. EXTREMITIES: No clubbing, cyanosis, or edema. No ecchymosis or swelling to the left wrist where the patient indicates the most of her pain is, she has tenderness to palpation over her carpal bones. No identifiable source of pain in the upper arm, forearm, elbow or shoulder. Range of motion normal. No snuffbox tenderness. SKIN: Warm, Dry, No erythema, No rash  ED Course  Procedures (including critical care time)  DIAGNOSTIC STUDIES: Oxygen Saturation is 98% on room air, normal by my interpretation.     Labs Reviewed - No data to display  Dg Hand Complete Left  02/26/2013   *RADIOLOGY REPORT*  Clinical Data: Status post fall; left hand pain.  LEFT HAND -  COMPLETE 3+ VIEW  Comparison: Left finger radiographs performed 06/12/2007  Findings: There is no evidence of fracture or dislocation.  The joint spaces are preserved; the soft tissues are unremarkable in appearance.  The carpal rows are intact, and demonstrate normal alignment.  IMPRESSION: No evidence of fracture or dislocation.   Original Report Authenticated By: Tonia Ghent, M.D.    1. Fall at home, initial encounter   2. Wrist pain, acute, left   3. Sprain    Medications  ibuprofen (ADVIL,MOTRIN) 800 MG tablet (  Given 02/26/13 0050)    MDM  Patient with left hand pain, x-ray is unremarkable. Patient likely has a mild sprain. No indication for narcotic pain relievers with this patient. Ibuprofen should suffice. Do not suspect an occult carpal bone fracture.  I personally performed the services described in this documentation, which was scribed in my presence. The recorded information has been reviewed and is accurate. Jones Skene, M.D.     Jones Skene, MD 02/27/13 [phone]

## 2013-02-26 MED ORDER — IBUPROFEN 800 MG PO TABS: 800.0000 mg | ORAL_TABLET | Freq: Three times a day (TID) | ORAL | Status: DC

## 2013-02-26 MED ORDER — CITALOPRAM HYDROBROMIDE 20 MG PO TABS: 20.0000 mg | ORAL_TABLET | Freq: Every day | ORAL | Status: DC

## 2013-02-26 MED ORDER — IBUPROFEN 800 MG PO TABS
ORAL_TABLET | ORAL | Status: AC
  Administered 2013-02-26: 01:00:00
  Filled 2013-02-26: qty 1

## 2013-03-02 ENCOUNTER — Ambulatory Visit: Payer: Self-pay | Admitting: Adult Health

## 2013-03-13 ENCOUNTER — Encounter: Payer: Self-pay | Admitting: *Deleted

## 2013-03-13 ENCOUNTER — Ambulatory Visit: Payer: Self-pay | Admitting: Adult Health

## 2013-07-01 ENCOUNTER — Emergency Department (HOSPITAL_COMMUNITY)
Admission: EM | Admit: 2013-07-01 | Discharge: 2013-07-01 | Disposition: A | Discharge status: Home / Self Care | Payer: Medicaid Other | Attending: Emergency Medicine | Admitting: Emergency Medicine

## 2013-07-01 ENCOUNTER — Encounter (HOSPITAL_COMMUNITY): Payer: Self-pay | Admitting: Emergency Medicine

## 2013-07-01 DIAGNOSIS — F329 Major depressive disorder, single episode, unspecified: Secondary | ICD-10-CM | POA: Insufficient documentation

## 2013-07-01 DIAGNOSIS — F410 Panic disorder [episodic paroxysmal anxiety] without agoraphobia: Secondary | ICD-10-CM | POA: Insufficient documentation

## 2013-07-01 DIAGNOSIS — F3289 Other specified depressive episodes: Secondary | ICD-10-CM | POA: Insufficient documentation

## 2013-07-01 DIAGNOSIS — Z8619 Personal history of other infectious and parasitic diseases: Secondary | ICD-10-CM | POA: Insufficient documentation

## 2013-07-01 DIAGNOSIS — IMO0002 Reserved for concepts with insufficient information to code with codable children: Secondary | ICD-10-CM | POA: Insufficient documentation

## 2013-07-01 DIAGNOSIS — F17200 Nicotine dependence, unspecified, uncomplicated: Secondary | ICD-10-CM | POA: Insufficient documentation

## 2013-07-01 DIAGNOSIS — Z79899 Other long term (current) drug therapy: Secondary | ICD-10-CM | POA: Insufficient documentation

## 2013-07-01 DIAGNOSIS — L0291 Cutaneous abscess, unspecified: Secondary | ICD-10-CM

## 2013-07-01 HISTORY — DX: Anxiety disorder, unspecified: F41.9

## 2013-07-01 HISTORY — DX: Postpartum depression: F53.0

## 2013-07-01 HISTORY — DX: Other mental disorders complicating the puerperium: O99.345

## 2013-07-01 MED ORDER — SULFAMETHOXAZOLE-TRIMETHOPRIM 800-160 MG PO TABS: 1.0000 | ORAL_TABLET | Freq: Two times a day (BID) | ORAL | Status: AC

## 2013-07-01 MED ORDER — LIDOCAINE HCL (PF) 2 % IJ SOLN
INTRAMUSCULAR | Status: AC
  Administered 2013-07-01: 17:00:00
  Filled 2013-07-01: qty 10

## 2013-07-01 MED ORDER — IBUPROFEN 800 MG PO TABS
800.0000 mg | ORAL_TABLET | Freq: Once | ORAL | Status: AC
  Administered 2013-07-01: 800 mg via ORAL

## 2013-07-01 MED ORDER — SULFAMETHOXAZOLE-TMP DS 800-160 MG PO TABS
1.0000 | ORAL_TABLET | Freq: Once | ORAL | Status: AC
  Administered 2013-07-01: 1 via ORAL
  Filled 2013-07-01: qty 1

## 2013-07-01 MED ORDER — IBUPROFEN 600 MG PO TABS: 600.0000 mg | ORAL_TABLET | Freq: Four times a day (QID) | ORAL | Status: DC | PRN

## 2013-07-01 MED ORDER — IBUPROFEN 800 MG PO TABS
ORAL_TABLET | ORAL | Status: AC
  Filled 2013-07-01: qty 1

## 2013-07-01 NOTE — ED Provider Notes (Signed)
CSN: [id number]     Arrival date & time 07/01/13  1525 History   First MD Initiated Contact with Patient 07/01/13 1605     Chief Complaint  Patient presents with  . Abscess   (Consider location/radiation/quality/duration/timing/severity/associated sxs/prior Treatment) Patient is a 22 y.o. female presenting with abscess. The history is provided by the patient.  Abscess Location:  Shoulder/arm Shoulder/arm abscess location:  L arm Size:  2 cm Abscess quality: fluctuance, induration, painful, redness and warmth   Abscess quality: not draining   Red streaking: no   Duration:  3 days Progression:  Worsening Pain details:    Quality:  Pressure, sharp and throbbing   Severity:  Moderate   Timing:  Constant   Progression:  Worsening Chronicity:  New Context comment:  She shaved her arms,  developed a small papule which got larger after squeezing it. Relieved by:  Nothing Worsened by:  Draining/squeezing Ineffective treatments:  Warm compresses Associated symptoms: no anorexia, no fatigue, no fever, no headaches, no nausea and no vomiting     Past Medical History  Diagnosis Date  . Panic attacks   . 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 Anxiety   . Post partum depression    Past Surgical History  Procedure Laterality Date  . Tonsillectomy    . Adenoidectomy     Family History  Problem Relation Age of Onset  . Other Neg Hx   . Asthma Sister   . Asthma Brother    History  Substance Use Topics  . Smoking status: Current Every Day Smoker -- 0.50 packs/day for 8 years    Types: Cigarettes  . Smokeless tobacco: Never Used  . Alcohol Use: No   OB History   Grav Para Term Preterm Abortions TAB SAB Ect Mult Living   1 1 1  0 0 0 0 0 0 1     Review of Systems  Constitutional: Negative for fever and fatigue.  HENT: Negative.   Respiratory: Negative.  Negative for shortness of breath.   Cardiovascular: Negative for chest pain.  Gastrointestinal: Negative for nausea,  vomiting and anorexia.  Genitourinary: Negative.   Musculoskeletal: Negative for arthralgias and joint swelling.  Skin: Positive for color change and wound. Negative for rash.  Neurological: Negative for dizziness, weakness, light-headedness, numbness and headaches.  Psychiatric/Behavioral: Negative.     Allergies  Review of patient's allergies indicates no known allergies.  Home Medications   Current Outpatient Rx  Name  Route  Sig  Dispense  Refill  . citalopram (CELEXA) 20 MG tablet   Oral   Take 1 tablet (20 mg total) by mouth daily.   30 tablet   0   . ibuprofen (ADVIL,MOTRIN) 600 MG tablet   Oral   Take 1 tablet (600 mg total) by mouth every 6 (six) hours as needed.   30 tablet   0   . ibuprofen (ADVIL,MOTRIN) 800 MG tablet   Oral   Take 1 tablet (800 mg total) by mouth 3 (three) times daily.   21 tablet   0   . sulfamethoxazole-trimethoprim (BACTRIM DS,SEPTRA DS) 800-160 MG per tablet   Oral   Take 1 tablet by mouth 2 (two) times daily.   20 tablet   0    BP 126/68  Pulse 79  Temp(Src) 98.2 F (36.8 C)  Resp 16  Ht 4\' 11"  (1.499 m)  Wt 140 lb (63.504 kg)  BMI 28.26 kg/m2  SpO2 100%  LMP 06/24/2013 Physical  Exam  Constitutional: She is oriented to person, place, and time. She appears well-developed and well-nourished.  HENT:  Head: Normocephalic.  Cardiovascular: Normal rate.   Pulmonary/Chest: Effort normal.  Musculoskeletal: Normal range of motion.  Neurological: She is alert and oriented to person, place, and time. No sensory deficit.  Skin: Skin is warm. There is erythema.  2 cm abscess left lateral forearm,  Raised,  Central fluctuance with rim of induration.  Surrounded by 2 cm erythema.  No red streaking,  No drainage.    ED Course  Procedures (including critical care time)  INCISION AND DRAINAGE Performed by: Burgess Amor Consent: Verbal consent obtained. Risks and benefits: risks, benefits and alternatives were discussed Type:  abscess  Body area: left forearm  Anesthesia: local infiltration  Incision was made with a scalpel.  Local anesthetic: lidocaine 2% without epinephrine  Anesthetic total: 3 ml  Complexity: complex Blunt dissection to break up loculations  Drainage: purulent  Drainage amount: moderate amount of purulence  Packing material: no packing  Patient tolerance: Patient tolerated the procedure well with no immediate complications.    Labs Review Labs Reviewed - No data to display Imaging Review No results found.  EKG Interpretation   None       MDM   1. Abscess and cellulitis    Bactrim,  Ibuprofen warm epsom salt soaks,  Encouraged recheck here in 2 days if not improved or if worsened.     Burgess Amor, PA-C 07/01/13 1705

## 2013-07-01 NOTE — ED Notes (Signed)
Patient has abscess to left forearm. Per patient shaved arms for first time., had ingrown hair in which she attempted to removed but states "It just made it worse." Patient reports "squeezing serosanguinous drainage out of abscess. No active drainage noted at this time. Patient reports using warm compresses.

## 2013-07-02 NOTE — ED Provider Notes (Signed)
Medical screening examination/treatment/procedure(s) were performed by non-physician practitioner and as supervising physician I was immediately available for consultation/collaboration.  EKG Interpretation   None         Charles B. Sheldon, MD 07/02/13 1323 

## 2013-08-09 ENCOUNTER — Encounter (HOSPITAL_COMMUNITY): Payer: Self-pay | Admitting: Emergency Medicine

## 2013-08-09 ENCOUNTER — Emergency Department (HOSPITAL_COMMUNITY)
Admission: EM | Admit: 2013-08-09 | Discharge: 2013-08-09 | Disposition: A | Discharge status: Home / Self Care | Payer: Medicaid Other | Attending: Emergency Medicine | Admitting: Emergency Medicine

## 2013-08-09 DIAGNOSIS — G43909 Migraine, unspecified, not intractable, without status migrainosus: Secondary | ICD-10-CM | POA: Insufficient documentation

## 2013-08-09 DIAGNOSIS — Z8619 Personal history of other infectious and parasitic diseases: Secondary | ICD-10-CM | POA: Insufficient documentation

## 2013-08-09 DIAGNOSIS — F17200 Nicotine dependence, unspecified, uncomplicated: Secondary | ICD-10-CM | POA: Insufficient documentation

## 2013-08-09 DIAGNOSIS — Z791 Long term (current) use of non-steroidal anti-inflammatories (NSAID): Secondary | ICD-10-CM | POA: Insufficient documentation

## 2013-08-09 DIAGNOSIS — F411 Generalized anxiety disorder: Secondary | ICD-10-CM | POA: Insufficient documentation

## 2013-08-09 HISTORY — DX: Migraine, unspecified, not intractable, without status migrainosus: G43.909

## 2013-08-09 MED ORDER — PROMETHAZINE HCL 12.5 MG PO TABS
25.0000 mg | ORAL_TABLET | Freq: Once | ORAL | Status: AC
  Administered 2013-08-09: 25 mg via ORAL
  Filled 2013-08-09: qty 2

## 2013-08-09 MED ORDER — PROMETHAZINE HCL 25 MG PO TABS: 25.0000 mg | ORAL_TABLET | Freq: Four times a day (QID) | ORAL | Status: AC | PRN

## 2013-08-09 MED ORDER — HYDROCODONE-ACETAMINOPHEN 5-325 MG PO TABS: 1.0000 | ORAL_TABLET | ORAL | Status: AC | PRN

## 2013-08-09 MED ORDER — HYDROCODONE-ACETAMINOPHEN 5-325 MG PO TABS
2.0000 | ORAL_TABLET | Freq: Once | ORAL | Status: AC
  Administered 2013-08-09: 2 via ORAL
  Filled 2013-08-09: qty 2

## 2013-08-09 MED ORDER — KETOROLAC TROMETHAMINE 10 MG PO TABS
10.0000 mg | ORAL_TABLET | Freq: Once | ORAL | Status: AC
  Administered 2013-08-09: 10 mg via ORAL
  Filled 2013-08-09: qty 1

## 2013-08-09 NOTE — ED Provider Notes (Signed)
CSN: [id number]     Arrival date & time 08/09/13  1908 History   First MD Initiated Contact with Patient 08/09/13 2244     Chief Complaint  Patient presents with  . Migraine   (Consider location/radiation/quality/duration/timing/severity/associated sxs/prior Treatment) Patient is a 23 y.o. female presenting with migraines. The history is provided by the patient.  Migraine This is a chronic problem. The current episode started yesterday. The problem occurs intermittently. The problem has been gradually worsening. Associated symptoms include headaches, nausea and vomiting. Pertinent negatives include no abdominal pain, arthralgias, chest pain, coughing, fever or neck pain. Exacerbated by: unknown. She has tried NSAIDs for the symptoms. The treatment provided no relief.    Past Medical History  Diagnosis Date  . Panic attacks   . 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 Anxiety   . Post partum depression   . Migraine    Past Surgical History  Procedure Laterality Date  . Tonsillectomy    . Adenoidectomy     Family History  Problem Relation Age of Onset  . Other Neg Hx   . Asthma Sister   . Asthma Brother    History  Substance Use Topics  . Smoking status: Current Every Day Smoker -- 0.50 packs/day for 8 years    Types: Cigarettes  . Smokeless tobacco: Never Used  . Alcohol Use: No   OB History   Grav Para Term Preterm Abortions TAB SAB Ect Mult Living   1 1 1  0 0 0 0 0 0 1     Review of Systems  Constitutional: Negative for fever and activity change.       All ROS Neg except as noted in HPI  HENT: Negative for nosebleeds.   Eyes: Negative for photophobia and discharge.  Respiratory: Negative for cough, shortness of breath and wheezing.   Cardiovascular: Negative for chest pain and palpitations.  Gastrointestinal: Positive for nausea and vomiting. Negative for abdominal pain and blood in stool.  Genitourinary: Negative for dysuria, frequency and hematuria.   Musculoskeletal: Negative for arthralgias, back pain and neck pain.  Skin: Negative.   Neurological: Positive for headaches. Negative for dizziness, seizures and speech difficulty.  Psychiatric/Behavioral: Negative for hallucinations and confusion. The patient is nervous/anxious.     Allergies  Review of patient's allergies indicates no known allergies.  Home Medications   Current Outpatient Rx  Name  Route  Sig  Dispense  Refill  . Ibuprofen (ADVIL MIGRAINE) 200 MG CAPS   Oral   Take 2 capsules by mouth 2 (two) times daily as needed (migraine).          BP 98/56  Pulse 61  Temp(Src) 98.3 F (36.8 C) (Oral)  Resp 18  Ht 4\' 11"  (1.499 m)  Wt 139 lb 12.8 oz (63.413 kg)  BMI 28.22 kg/m2  SpO2 98%  LMP 07/26/2013 Physical Exam  Nursing note and vitals reviewed. Constitutional: She is oriented to person, place, and time. She appears well-developed and well-nourished.  Non-toxic appearance.  HENT:  Head: Normocephalic.  Right Ear: Tympanic membrane and external ear normal.  Left Ear: Tympanic membrane and external ear normal.  Eyes: EOM and lids are normal. Pupils are equal, round, and reactive to light.  Neck: Normal range of motion. Neck supple. Carotid bruit is not present.  Cardiovascular: Normal rate, regular rhythm, normal heart sounds, intact distal pulses and normal pulses.   Pulmonary/Chest: Breath sounds normal. No respiratory distress.  Abdominal: Soft. Bowel sounds are normal.  There is no tenderness. There is no guarding.  Musculoskeletal: Normal range of motion.  Lymphadenopathy:       Head (right side): No submandibular adenopathy present.       Head (left side): No submandibular adenopathy present.    She has no cervical adenopathy.  Neurological: She is alert and oriented to person, place, and time. She has normal strength. No cranial nerve deficit or sensory deficit. She exhibits normal muscle tone. Coordination normal.  Skin: Skin is warm and dry.   Psychiatric: She has a normal mood and affect. Her speech is normal.    ED Course  Procedures (including critical care time) Labs Review Labs Reviewed - No data to display Imaging Review No results found.  EKG Interpretation   None       MDM  No diagnosis found. *I have reviewed nursing notes, vital signs, and all appropriate lab and imaging results for this patient. Patient presents to the emergency department with complaint of migraine accompanied by nausea and vomiting that started on yesterday. No gross neurologic deficits appreciated at this time. Vital signs are well within normal limits. Pulse oximetry is 100% on room air. Within normal limits by my interpretation. The plan at this time is for the patient to receive a prescription for promethazine 25 mg, and Norco 5 mg, and diclofenac 2 times daily. Patient to follow with her primary physician for recheck and further evaluation of her headaches.  Kathie Dike, PA-C 08/09/13 2329

## 2013-08-09 NOTE — ED Provider Notes (Signed)
Medical screening examination/treatment/procedure(s) were performed by non-physician practitioner and as supervising physician I was immediately available for consultation/collaboration.  EKG Interpretation   None       Raeshawn Tafolla, MD, FACEP   Cortney Mckinney L Maliha Outten, MD 08/09/13 2331 

## 2013-08-09 NOTE — ED Notes (Signed)
Patient c/o migraine with N/V since yesterday.

## 2013-08-09 NOTE — ED Notes (Signed)
No answer in all waiting areas 

## 2013-08-09 NOTE — Discharge Instructions (Signed)
Please increase water and juices. Please use promethazine for nausea. Please take 3 tablets of ibuprofen along with the Norco for pain. [facility] for evaluation and management of your headaches and nausea. Migraine Headache A migraine headache is an intense, throbbing pain on one or both sides of your head. A migraine can last for 30 minutes to several hours. CAUSES  The exact cause of a migraine headache is not always known. However, a migraine may be caused when nerves in the brain become irritated and release chemicals that cause inflammation. This causes pain. Certain things may also trigger migraines, such as:  Alcohol.  Smoking.  Stress.  Menstruation.  Aged cheeses.  Foods or drinks that contain nitrates, glutamate, aspartame, or tyramine.  Lack of sleep.  Chocolate.  Caffeine.  Hunger.  Physical exertion.  Fatigue.  Medicines used to treat chest pain (nitroglycerine), birth control pills, estrogen, and some blood pressure medicines. SIGNS AND SYMPTOMS  Pain on one or both sides of your head.  Pulsating or throbbing pain.  Severe pain that prevents daily activities.  Pain that is aggravated by any physical activity.  Nausea, vomiting, or both.  Dizziness.  Pain with exposure to bright lights, loud noises, or activity.  General sensitivity to bright lights, loud noises, or smells. Before you get a migraine, you may get warning signs that a migraine is coming (aura). An aura may include:  Seeing flashing lights.  Seeing bright spots, halos, or zig-zag lines.  Having tunnel vision or blurred vision.  Having feelings of numbness or tingling.  Having trouble talking.  Having muscle weakness. DIAGNOSIS  A migraine headache is often diagnosed based on:  Symptoms.  Physical exam.  A CT scan or MRI of your head. These imaging tests cannot diagnose migraines, but they can help rule out other causes of  headaches. TREATMENT Medicines may be given for pain and nausea. Medicines can also be given to help prevent recurrent migraines.  HOME CARE INSTRUCTIONS  Only take over-the-counter or prescription medicines for pain or discomfort as directed by your health care provider. The use of long-term narcotics is not recommended.  Lie down in a dark, quiet room when you have a migraine.  Keep a journal to find out what may trigger your migraine headaches. For example, write down:  What you eat and drink.  How much sleep you get.  Any change to your diet or medicines.  Limit alcohol consumption.  Quit smoking if you smoke.  Get 7 9 hours of sleep, or as recommended by your health care provider.  Limit stress.  Keep lights dim if bright lights bother you and make your migraines worse. SEEK IMMEDIATE MEDICAL CARE IF:   Your migraine becomes severe.  You have a fever.  You have a stiff neck.  You have vision loss.  You have muscular weakness or loss of muscle control.  You start losing your balance or have trouble walking.  You feel faint or pass out.  You have severe symptoms that are different from your first symptoms. MAKE SURE YOU:   Understand these instructions.  Will watch your condition.  Will get help right away if you are not doing well or get worse. Document Released: 07/06/2005 Document Revised: 04/26/2013 Document Reviewed: 03/13/2013 [facility] Patient Information 2014 [employer] [place].

## 2013-08-09 NOTE — ED Notes (Signed)
Discharge instructions given and reviewed with patient.  Prescriptions given for Phenergan and Hydrocodone; effects and use explained.  Patient verbalized understanding of sedating effects of medications and to follow up with PMD as needed for further management of headache.

## 2013-08-16 ENCOUNTER — Emergency Department (HOSPITAL_COMMUNITY)
Admission: EM | Admit: 2013-08-16 | Discharge: 2013-08-20 | Disposition: E | Payer: Medicaid Other | Attending: Emergency Medicine | Admitting: Emergency Medicine

## 2013-08-16 DIAGNOSIS — Y92410 Unspecified street and highway as the place of occurrence of the external cause: Secondary | ICD-10-CM | POA: Insufficient documentation

## 2013-08-16 DIAGNOSIS — I469 Cardiac arrest, cause unspecified: Secondary | ICD-10-CM | POA: Insufficient documentation

## 2013-08-16 DIAGNOSIS — Y9389 Activity, other specified: Secondary | ICD-10-CM | POA: Insufficient documentation

## 2013-08-16 DIAGNOSIS — R231 Pallor: Secondary | ICD-10-CM | POA: Insufficient documentation

## 2013-08-16 MED ORDER — EPINEPHRINE HCL 0.1 MG/ML IJ SOSY
PREFILLED_SYRINGE | INTRAMUSCULAR | Status: AC | PRN
  Administered 2013-08-16: 1 mg via INTRAVENOUS

## 2013-08-16 MED FILL — Medication: Qty: 1 | Status: AC
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
## 2013-08-20 NOTE — Progress Notes (Signed)
On-call Chaplain responded to referral from daytime chaplain to assist mother and family of deceased patient.  Sat with mother at bedside of patient, welcomed siblings of deceased patient. Offered comfort measures, limited spiritual conversation.    Rev. Irond[place]Jan Hill, [place]Chaplain [phone]
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
## 2013-08-20 NOTE — Code Documentation (Signed)
Pulse check, no pulse. PEA noted on the monitor.

## 2013-08-20 NOTE — ED Notes (Signed)
Mother at bedside with chaplain

## 2013-08-20 NOTE — Code Documentation (Signed)
Patient time of death occurred at [id number].

## 2013-08-20 NOTE — ED Notes (Signed)
Alice Foster; pt's brother [phone] (cell), [phone] (home) contact info
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
## 2013-08-20 DEATH — deceased

## 2014-05-21 ENCOUNTER — Encounter (HOSPITAL_COMMUNITY): Payer: Self-pay | Admitting: Emergency Medicine
